# Patient Record
Sex: Male | Born: 1935 | Race: White | Hispanic: No | Marital: Married
Health system: Southern US, Community
[De-identification: ages and names within clinical notes are randomized; demographics above are authoritative.]

---

## 2013-10-15 ENCOUNTER — Inpatient Hospital Stay
Admission: AD | Admit: 2013-10-15 | Discharge: 2013-10-25 | Disposition: E | Payer: Self-pay | Source: Ambulatory Visit | Attending: Internal Medicine | Admitting: Internal Medicine

## 2013-10-16 ENCOUNTER — Other Ambulatory Visit (HOSPITAL_COMMUNITY): Payer: Self-pay

## 2013-10-16 LAB — COMPREHENSIVE METABOLIC PANEL
ALBUMIN: 2.4 g/dL — AB (ref 3.5–5.2)
ALK PHOS: 118 U/L — AB (ref 39–117)
ALT: 41 U/L (ref 0–53)
AST: 21 U/L (ref 0–37)
BUN: 31 mg/dL — ABNORMAL HIGH (ref 6–23)
CO2: 24 mEq/L (ref 19–32)
CREATININE: 0.76 mg/dL (ref 0.50–1.35)
Calcium: 6.3 mg/dL — CL (ref 8.4–10.5)
Chloride: 97 mEq/L (ref 96–112)
GFR calc non Af Amer: 85 mL/min — ABNORMAL LOW (ref >90)
Glucose, Bld: 279 mg/dL — ABNORMAL HIGH (ref 70–99)
POTASSIUM: 3.2 meq/L — AB (ref 3.7–5.3)
Sodium: 136 mEq/L — ABNORMAL LOW (ref 137–147)
TOTAL PROTEIN: 6.3 g/dL (ref 6.0–8.3)
Total Bilirubin: 1.2 mg/dL (ref 0.3–1.2)

## 2013-10-16 LAB — CBC
HEMATOCRIT: 31.4 % — AB (ref 39.0–52.0)
Hemoglobin: 10.7 g/dL — ABNORMAL LOW (ref 13.0–17.0)
MCH: 30.1 pg (ref 26.0–34.0)
MCHC: 34.1 g/dL (ref 30.0–36.0)
MCV: 88.5 fL (ref 78.0–100.0)
Platelets: 164 10*3/uL (ref 150–400)
RBC: 3.55 MIL/uL — ABNORMAL LOW (ref 4.22–5.81)
RDW: 15.3 % (ref 11.5–15.5)
WBC: 6.7 10*3/uL (ref 4.0–10.5)

## 2013-10-16 LAB — PREALBUMIN: PREALBUMIN: 19.3 mg/dL (ref 17.0–34.0)

## 2013-10-16 LAB — TSH: TSH: 0.764 u[IU]/mL (ref 0.350–4.500)

## 2013-10-17 ENCOUNTER — Other Ambulatory Visit (HOSPITAL_COMMUNITY): Payer: Self-pay

## 2013-10-18 LAB — CBC WITH DIFFERENTIAL/PLATELET
BASOS ABS: 0 10*3/uL (ref 0.0–0.1)
Basophils Relative: 0 % (ref 0–1)
EOS ABS: 0 10*3/uL (ref 0.0–0.7)
Eosinophils Relative: 0 % (ref 0–5)
HCT: 33.8 % — ABNORMAL LOW (ref 39.0–52.0)
HEMOGLOBIN: 11.7 g/dL — AB (ref 13.0–17.0)
Lymphocytes Relative: 12 % (ref 12–46)
Lymphs Abs: 1.5 10*3/uL (ref 0.7–4.0)
MCH: 31.2 pg (ref 26.0–34.0)
MCHC: 34.6 g/dL (ref 30.0–36.0)
MCV: 90.1 fL (ref 78.0–100.0)
MONOS PCT: 5 % (ref 3–12)
Monocytes Absolute: 0.6 10*3/uL (ref 0.1–1.0)
Neutro Abs: 10.4 10*3/uL — ABNORMAL HIGH (ref 1.7–7.7)
Neutrophils Relative %: 83 % — ABNORMAL HIGH (ref 43–77)
Platelets: 208 10*3/uL (ref 150–400)
RBC: 3.75 MIL/uL — ABNORMAL LOW (ref 4.22–5.81)
RDW: 16.3 % — ABNORMAL HIGH (ref 11.5–15.5)
WBC: 12.5 10*3/uL — ABNORMAL HIGH (ref 4.0–10.5)

## 2013-10-18 LAB — RENAL FUNCTION PANEL
ALBUMIN: 2.9 g/dL — AB (ref 3.5–5.2)
BUN: 54 mg/dL — AB (ref 6–23)
CALCIUM: 7.9 mg/dL — AB (ref 8.4–10.5)
CO2: 24 mEq/L (ref 19–32)
Chloride: 98 mEq/L (ref 96–112)
Creatinine, Ser: 0.97 mg/dL (ref 0.50–1.35)
GFR calc Af Amer: 89 mL/min — ABNORMAL LOW (ref >90)
GFR calc non Af Amer: 76 mL/min — ABNORMAL LOW (ref >90)
GLUCOSE: 311 mg/dL — AB (ref 70–99)
PHOSPHORUS: 3.6 mg/dL (ref 2.3–4.6)
POTASSIUM: 4.6 meq/L (ref 3.7–5.3)
SODIUM: 138 meq/L (ref 137–147)

## 2013-10-18 LAB — URINALYSIS, ROUTINE W REFLEX MICROSCOPIC
Bilirubin Urine: NEGATIVE
GLUCOSE, UA: 250 mg/dL — AB
Hgb urine dipstick: NEGATIVE
Ketones, ur: NEGATIVE mg/dL
LEUKOCYTES UA: NEGATIVE
Nitrite: NEGATIVE
PH: 5.5 (ref 5.0–8.0)
PROTEIN: NEGATIVE mg/dL
SPECIFIC GRAVITY, URINE: 1.017 (ref 1.005–1.030)
Urobilinogen, UA: 1 mg/dL (ref 0.0–1.0)

## 2013-10-18 LAB — MAGNESIUM: Magnesium: 2.2 mg/dL (ref 1.5–2.5)

## 2013-10-19 ENCOUNTER — Other Ambulatory Visit (HOSPITAL_COMMUNITY): Payer: Self-pay

## 2013-10-19 LAB — COMPREHENSIVE METABOLIC PANEL
ALBUMIN: 3.1 g/dL — AB (ref 3.5–5.2)
ALT: 56 U/L — ABNORMAL HIGH (ref 0–53)
AST: 30 U/L (ref 0–37)
Alkaline Phosphatase: 171 U/L — ABNORMAL HIGH (ref 39–117)
BUN: 85 mg/dL — ABNORMAL HIGH (ref 6–23)
CALCIUM: 9.1 mg/dL (ref 8.4–10.5)
CO2: 24 mEq/L (ref 19–32)
Chloride: 98 mEq/L (ref 96–112)
Creatinine, Ser: 1.08 mg/dL (ref 0.50–1.35)
GFR calc Af Amer: 73 mL/min — ABNORMAL LOW (ref >90)
GFR, EST NON AFRICAN AMERICAN: 63 mL/min — AB (ref >90)
Glucose, Bld: 369 mg/dL — ABNORMAL HIGH (ref 70–99)
Potassium: 5.3 mEq/L (ref 3.7–5.3)
SODIUM: 141 meq/L (ref 137–147)
Total Bilirubin: 1.1 mg/dL (ref 0.3–1.2)
Total Protein: 7.9 g/dL (ref 6.0–8.3)

## 2013-10-19 LAB — CBC WITH DIFFERENTIAL/PLATELET
BASOS ABS: 0 10*3/uL (ref 0.0–0.1)
Basophils Relative: 0 % (ref 0–1)
EOS PCT: 0 % (ref 0–5)
Eosinophils Absolute: 0 10*3/uL (ref 0.0–0.7)
HCT: 39.1 % (ref 39.0–52.0)
Hemoglobin: 13.1 g/dL (ref 13.0–17.0)
Lymphocytes Relative: 9 % — ABNORMAL LOW (ref 12–46)
Lymphs Abs: 1.8 10*3/uL (ref 0.7–4.0)
MCH: 30.5 pg (ref 26.0–34.0)
MCHC: 33.5 g/dL (ref 30.0–36.0)
MCV: 91.1 fL (ref 78.0–100.0)
MONO ABS: 0.6 10*3/uL (ref 0.1–1.0)
Monocytes Relative: 3 % (ref 3–12)
Neutro Abs: 17.4 10*3/uL — ABNORMAL HIGH (ref 1.7–7.7)
Neutrophils Relative %: 87 % — ABNORMAL HIGH (ref 43–77)
PLATELETS: 272 10*3/uL (ref 150–400)
RBC: 4.29 MIL/uL (ref 4.22–5.81)
RDW: 16.7 % — AB (ref 11.5–15.5)
WBC: 19.8 10*3/uL — ABNORMAL HIGH (ref 4.0–10.5)

## 2013-10-19 LAB — PROCALCITONIN: PROCALCITONIN: 0.75 ng/mL

## 2013-10-19 LAB — PREALBUMIN: Prealbumin: 25.3 mg/dL (ref 17.0–34.0)

## 2013-10-19 LAB — CLOSTRIDIUM DIFFICILE BY PCR: Toxigenic C. Difficile by PCR: NEGATIVE

## 2013-10-19 MED ORDER — CEFAZOLIN SODIUM-DEXTROSE 2-3 GM-% IV SOLR: 2.0000 g | Freq: Once | INTRAVENOUS | Status: DC

## 2013-10-19 NOTE — H&P (Signed)
Referring Physician: Dr. Laren Everts HPI: Zachary Clarke is an 80 y.o. male who is confused and agitated during exam and no family is present today. All history is obtained by chart review. The patient was transferred to [facility] 09/24/13 for respiratory failure and unable to wean. The patient has been evaluated for dysphagia given severe malnutrition and has been on a nectar thick dysphagia 2 diet with poor intake <25%. IR received request for image guided percutaneous gastric tube placement.   Past Medical History: COPD, Pulmonary fibrosis, bronchiectasis, DM2, renal failure, respiratory failure, PVD, ITP, NHL, Lung cancer, GERD, Anemia, DVT  Past Surgical History: lung resection 2010  Family History: No family history on file.  Social History: Previous tobacco user  Allergies: Percocet, tramadol, allopurinol, coumadin  Medications: See chart for full medication list. Aspirin 69m, solu-medrol, lasix, lovenox  Please HPI for pertinent positives, otherwise complete 10 system ROS negative.  Physical Exam: Temp 40F, HR: 78bpm, O2: 98% fio2 35% on O2 mask, BP: 124/783mg There is no height or weight on file to calculate BMI.  General Appearance:  Alert, confused, agitated  Head:  Normocephalic, without obvious abnormality, atraumatic  Neck: Supple, symmetrical, trachea midline  Lungs:   Wheezes bilaterally, diminshed bases bilaterally, no w/r/r, respirations unlabored without use of accessory muscles.  Chest Wall:  No tenderness or deformity  Heart:  Regular rate and rhythm, S1, S2 normal, no murmur, rub or gallop.  Abdomen:   Soft, non-tender, non distended, (+) BS  Extremities: Extremities normal, atraumatic, no cyanosis or edema  Neurologic: No gross deficits.   Results for orders placed during the hospital encounter of 09/24/2013 (from the past 48 hour(s))  CBC WITH DIFFERENTIAL     Status: Abnormal   Collection Time    10/18/13  5:00 AM      Result Value Range   WBC 12.5 (*) 4.0 - 10.5 K/uL    RBC 3.75 (*) 4.22 - 5.81 MIL/uL   Hemoglobin 11.7 (*) 13.0 - 17.0 g/dL   HCT 33.8 (*) 39.0 - 52.0 %   MCV 90.1  78.0 - 100.0 fL   MCH 31.2  26.0 - 34.0 pg   MCHC 34.6  30.0 - 36.0 g/dL   RDW 16.3 (*) 11.5 - 15.5 %   Platelets 208  150 - 400 K/uL   Neutrophils Relative % 83 (*) 43 - 77 %   Neutro Abs 10.4 (*) 1.7 - 7.7 K/uL   Lymphocytes Relative 12  12 - 46 %   Lymphs Abs 1.5  0.7 - 4.0 K/uL   Monocytes Relative 5  3 - 12 %   Monocytes Absolute 0.6  0.1 - 1.0 K/uL   Eosinophils Relative 0  0 - 5 %   Eosinophils Absolute 0.0  0.0 - 0.7 K/uL   Basophils Relative 0  0 - 1 %   Basophils Absolute 0.0  0.0 - 0.1 K/uL  MAGNESIUM     Status: None   Collection Time    10/18/13  5:00 AM      Result Value Range   Magnesium 2.2  1.5 - 2.5 mg/dL  RENAL FUNCTION PANEL     Status: Abnormal   Collection Time    10/18/13  5:00 AM      Result Value Range   Sodium 138  137 - 147 mEq/L   Potassium 4.6  3.7 - 5.3 mEq/L   Chloride 98  96 - 112 mEq/L   CO2 24  19 - 32 mEq/L   Glucose,  Bld 311 (*) 70 - 99 mg/dL   BUN 54 (*) 6 - 23 mg/dL   Creatinine, Ser 0.97  0.50 - 1.35 mg/dL   Calcium 7.9 (*) 8.4 - 10.5 mg/dL   Phosphorus 3.6  2.3 - 4.6 mg/dL   Albumin 2.9 (*) 3.5 - 5.2 g/dL   GFR calc non Af Amer 76 (*) >90 mL/min   GFR calc Af Amer 89 (*) >90 mL/min   Comment: (NOTE)     The eGFR has been calculated using the CKD EPI equation.     This calculation has not been validated in all clinical situations.     eGFR's persistently <90 mL/min signify possible Chronic Kidney     Disease.  URINALYSIS, ROUTINE W REFLEX MICROSCOPIC     Status: Abnormal   Collection Time    10/18/13  7:43 PM      Result Value Range   Color, Urine YELLOW  YELLOW   APPearance CLOUDY (*) CLEAR   Specific Gravity, Urine 1.017  1.005 - 1.030   pH 5.5  5.0 - 8.0   Glucose, UA 250 (*) NEGATIVE mg/dL   Hgb urine dipstick NEGATIVE  NEGATIVE   Bilirubin Urine NEGATIVE  NEGATIVE   Ketones, ur NEGATIVE  NEGATIVE mg/dL    Protein, ur NEGATIVE  NEGATIVE mg/dL   Urobilinogen, UA 1.0  0.0 - 1.0 mg/dL   Nitrite NEGATIVE  NEGATIVE   Leukocytes, UA NEGATIVE  NEGATIVE   Comment: MICROSCOPIC NOT DONE ON URINES WITH NEGATIVE PROTEIN, BLOOD, LEUKOCYTES, NITRITE, OR GLUCOSE <1000 mg/dL.  CBC WITH DIFFERENTIAL     Status: Abnormal   Collection Time    10/19/13  5:40 AM      Result Value Range   WBC 19.8 (*) 4.0 - 10.5 K/uL   RBC 4.29  4.22 - 5.81 MIL/uL   Hemoglobin 13.1  13.0 - 17.0 g/dL   HCT 39.1  39.0 - 52.0 %   MCV 91.1  78.0 - 100.0 fL   MCH 30.5  26.0 - 34.0 pg   MCHC 33.5  30.0 - 36.0 g/dL   RDW 16.7 (*) 11.5 - 15.5 %   Platelets 272  150 - 400 K/uL   Neutrophils Relative % 87 (*) 43 - 77 %   Neutro Abs 17.4 (*) 1.7 - 7.7 K/uL   Lymphocytes Relative 9 (*) 12 - 46 %   Lymphs Abs 1.8  0.7 - 4.0 K/uL   Monocytes Relative 3  3 - 12 %   Monocytes Absolute 0.6  0.1 - 1.0 K/uL   Eosinophils Relative 0  0 - 5 %   Eosinophils Absolute 0.0  0.0 - 0.7 K/uL   Basophils Relative 0  0 - 1 %   Basophils Absolute 0.0  0.0 - 0.1 K/uL  COMPREHENSIVE METABOLIC PANEL     Status: Abnormal   Collection Time    10/19/13  5:40 AM      Result Value Range   Sodium 141  137 - 147 mEq/L   Potassium 5.3  3.7 - 5.3 mEq/L   Chloride 98  96 - 112 mEq/L   CO2 24  19 - 32 mEq/L   Glucose, Bld 369 (*) 70 - 99 mg/dL   BUN 85 (*) 6 - 23 mg/dL   Creatinine, Ser 1.08  0.50 - 1.35 mg/dL   Calcium 9.1  8.4 - 10.5 mg/dL   Total Protein 7.9  6.0 - 8.3 g/dL   Albumin 3.1 (*) 3.5 - 5.2 g/dL  AST 30  0 - 37 U/L   ALT 56 (*) 0 - 53 U/L   Alkaline Phosphatase 171 (*) 39 - 117 U/L   Total Bilirubin 1.1  0.3 - 1.2 mg/dL   GFR calc non Af Amer 63 (*) >90 mL/min   GFR calc Af Amer 73 (*) >90 mL/min   Comment: (NOTE)     The eGFR has been calculated using the CKD EPI equation.     This calculation has not been validated in all clinical situations.     eGFR's persistently <90 mL/min signify possible Chronic Kidney     Disease.   PREALBUMIN     Status: None   Collection Time    10/19/13  5:40 AM      Result Value Range   Prealbumin 25.3  17.0 - 34.0 mg/dL   Comment: Performed at [facility]   No results found.  Assessment/Plan Respiratory failure s/p extubation 10/08/13. Dysphagia. Malnutrition <25% daily intake. Request for image guided percutaneous gastric tube placement. Images and labs reviewed. Leukocytosis, afebrile, UA 10/18/13 negative for UTI. Will give barium today via NGT, check KUB and labs 10/20/13, ancef ordered on call. Consent is pending, waiting to talk with patient's spouse. DVT, on lovenox, will hold 1/27. Non hodgkins lymphoma, chemotherapy 2012 Non small cell lung cancer history of resection 2010 Anemia DM type 2 COPD Pulmonary fibrosis GERD Renal failure, resolved.  PVD History of ITP   Rockney Ghee 10/19/2013, 1:42 PM

## 2013-10-20 ENCOUNTER — Other Ambulatory Visit (HOSPITAL_COMMUNITY): Payer: Self-pay

## 2013-10-20 LAB — CBC WITH DIFFERENTIAL/PLATELET
BASOS ABS: 0 10*3/uL (ref 0.0–0.1)
Basophils Relative: 0 % (ref 0–1)
Eosinophils Absolute: 0 10*3/uL (ref 0.0–0.7)
Eosinophils Relative: 0 % (ref 0–5)
HEMATOCRIT: 35.1 % — AB (ref 39.0–52.0)
Hemoglobin: 12.1 g/dL — ABNORMAL LOW (ref 13.0–17.0)
LYMPHS ABS: 2.9 10*3/uL (ref 0.7–4.0)
Lymphocytes Relative: 10 % — ABNORMAL LOW (ref 12–46)
MCH: 31.1 pg (ref 26.0–34.0)
MCHC: 34.5 g/dL (ref 30.0–36.0)
MCV: 90.2 fL (ref 78.0–100.0)
Monocytes Absolute: 0.9 10*3/uL (ref 0.1–1.0)
Monocytes Relative: 3 % (ref 3–12)
NEUTROS ABS: 24.9 10*3/uL — AB (ref 1.7–7.7)
Neutrophils Relative %: 87 % — ABNORMAL HIGH (ref 43–77)
Platelets: 267 10*3/uL (ref 150–400)
RBC: 3.89 MIL/uL — ABNORMAL LOW (ref 4.22–5.81)
RDW: 16.6 % — AB (ref 11.5–15.5)
WBC: 28.7 10*3/uL — ABNORMAL HIGH (ref 4.0–10.5)

## 2013-10-20 LAB — BASIC METABOLIC PANEL
BUN: 144 mg/dL — ABNORMAL HIGH (ref 6–23)
CO2: 19 mEq/L (ref 19–32)
CREATININE: 2.45 mg/dL — AB (ref 0.50–1.35)
Calcium: 8.1 mg/dL — ABNORMAL LOW (ref 8.4–10.5)
Chloride: 90 mEq/L — ABNORMAL LOW (ref 96–112)
GFR, EST AFRICAN AMERICAN: 27 mL/min — AB (ref >90)
GFR, EST NON AFRICAN AMERICAN: 24 mL/min — AB (ref >90)
Glucose, Bld: 515 mg/dL — ABNORMAL HIGH (ref 70–99)
POTASSIUM: 5.8 meq/L — AB (ref 3.7–5.3)
Sodium: 137 mEq/L (ref 137–147)

## 2013-10-20 LAB — PROCALCITONIN: Procalcitonin: 1.09 ng/mL

## 2013-10-20 LAB — PROTIME-INR
INR: 1.3 (ref 0.00–1.49)
Prothrombin Time: 15.9 seconds — ABNORMAL HIGH (ref 11.6–15.2)

## 2013-10-25 DEATH — deceased

## 2014-12-24 IMAGING — CR DG CHEST 1V PORT
1 series · 1 of 1 positions shown · non-contrast
Comparison: Single view of the chest 10/16/2013.

CLINICAL DATA: Respiratory failure.

EXAM:
PORTABLE CHEST - 1 VIEW

[AP]
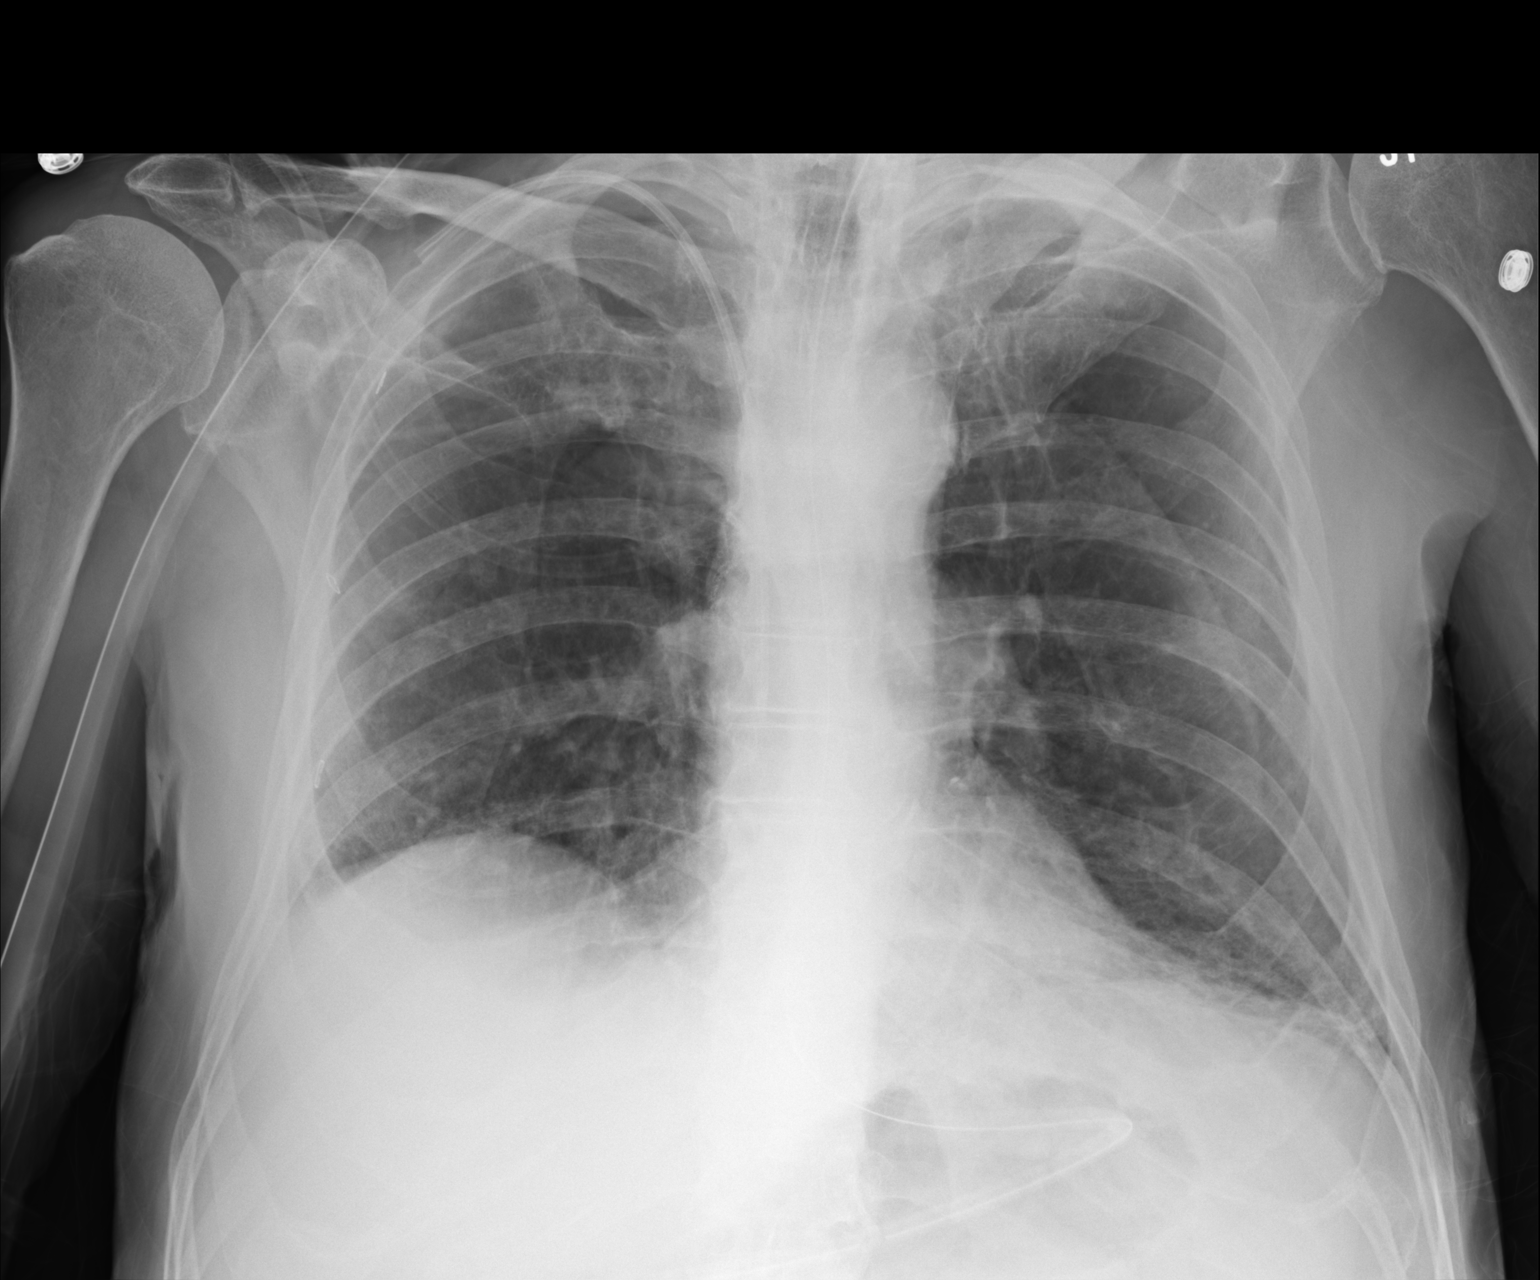

[1 of 1 positions shown; findings below may reference images not displayed]

FINDINGS: The patient has a new NG tube which courses into the stomach and
below the inferior margin of the film. Right IJ catheter is
unchanged. Aeration is improved compatible with decreased
interstitial edema. Mild basilar atelectasis is noted. No
pneumothorax. Heart size is normal.
IMPRESSION: Improved bilateral aeration most compatible with decreased edema.

NG tube courses into the stomach and below the inferior margin of
the film.

## 2014-12-25 IMAGING — DX DG ABD PORTABLE 1V
2 series · 2 of 2 positions shown · non-contrast
Comparison: Portable exam 6011 hr compared to 10/17/2013

CLINICAL DATA: Pre G-tube placement, assess barium distribution

EXAM:
PORTABLE ABDOMEN - 1 VIEW

[supine ap (1 of 2)]
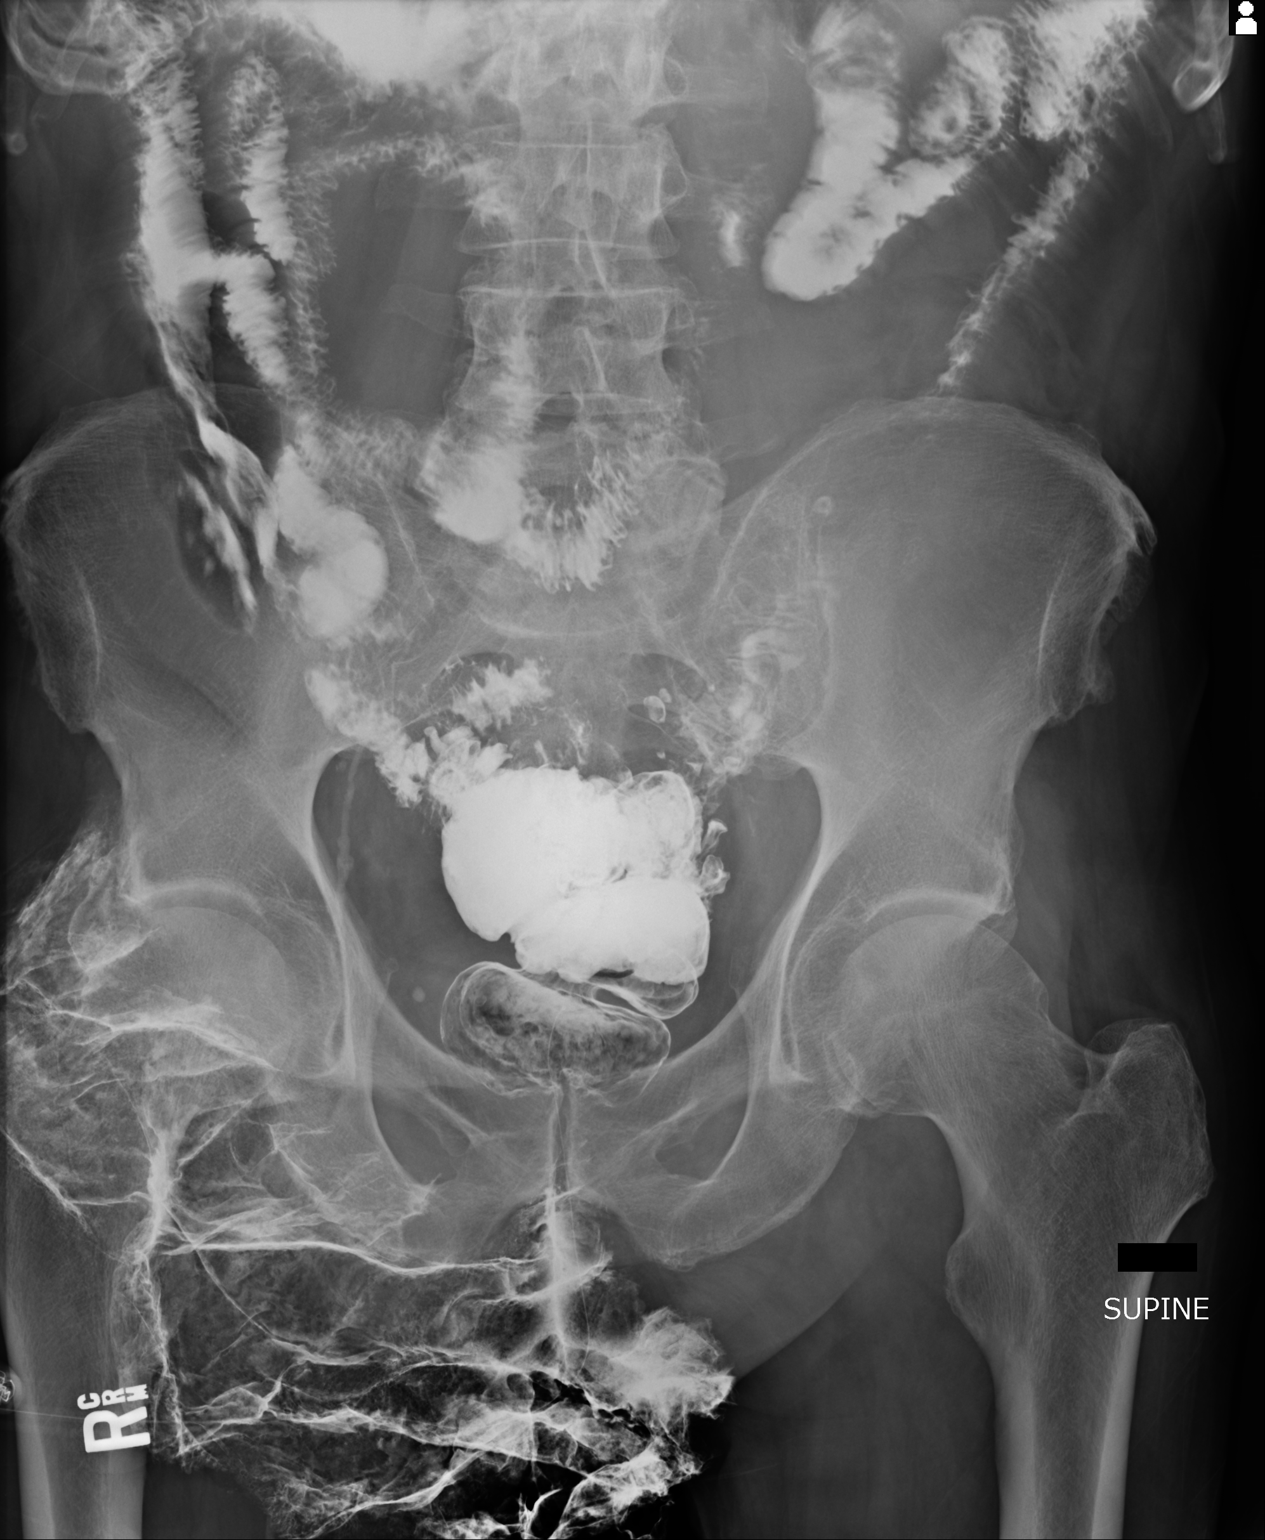

[supine ap (2 of 2)]
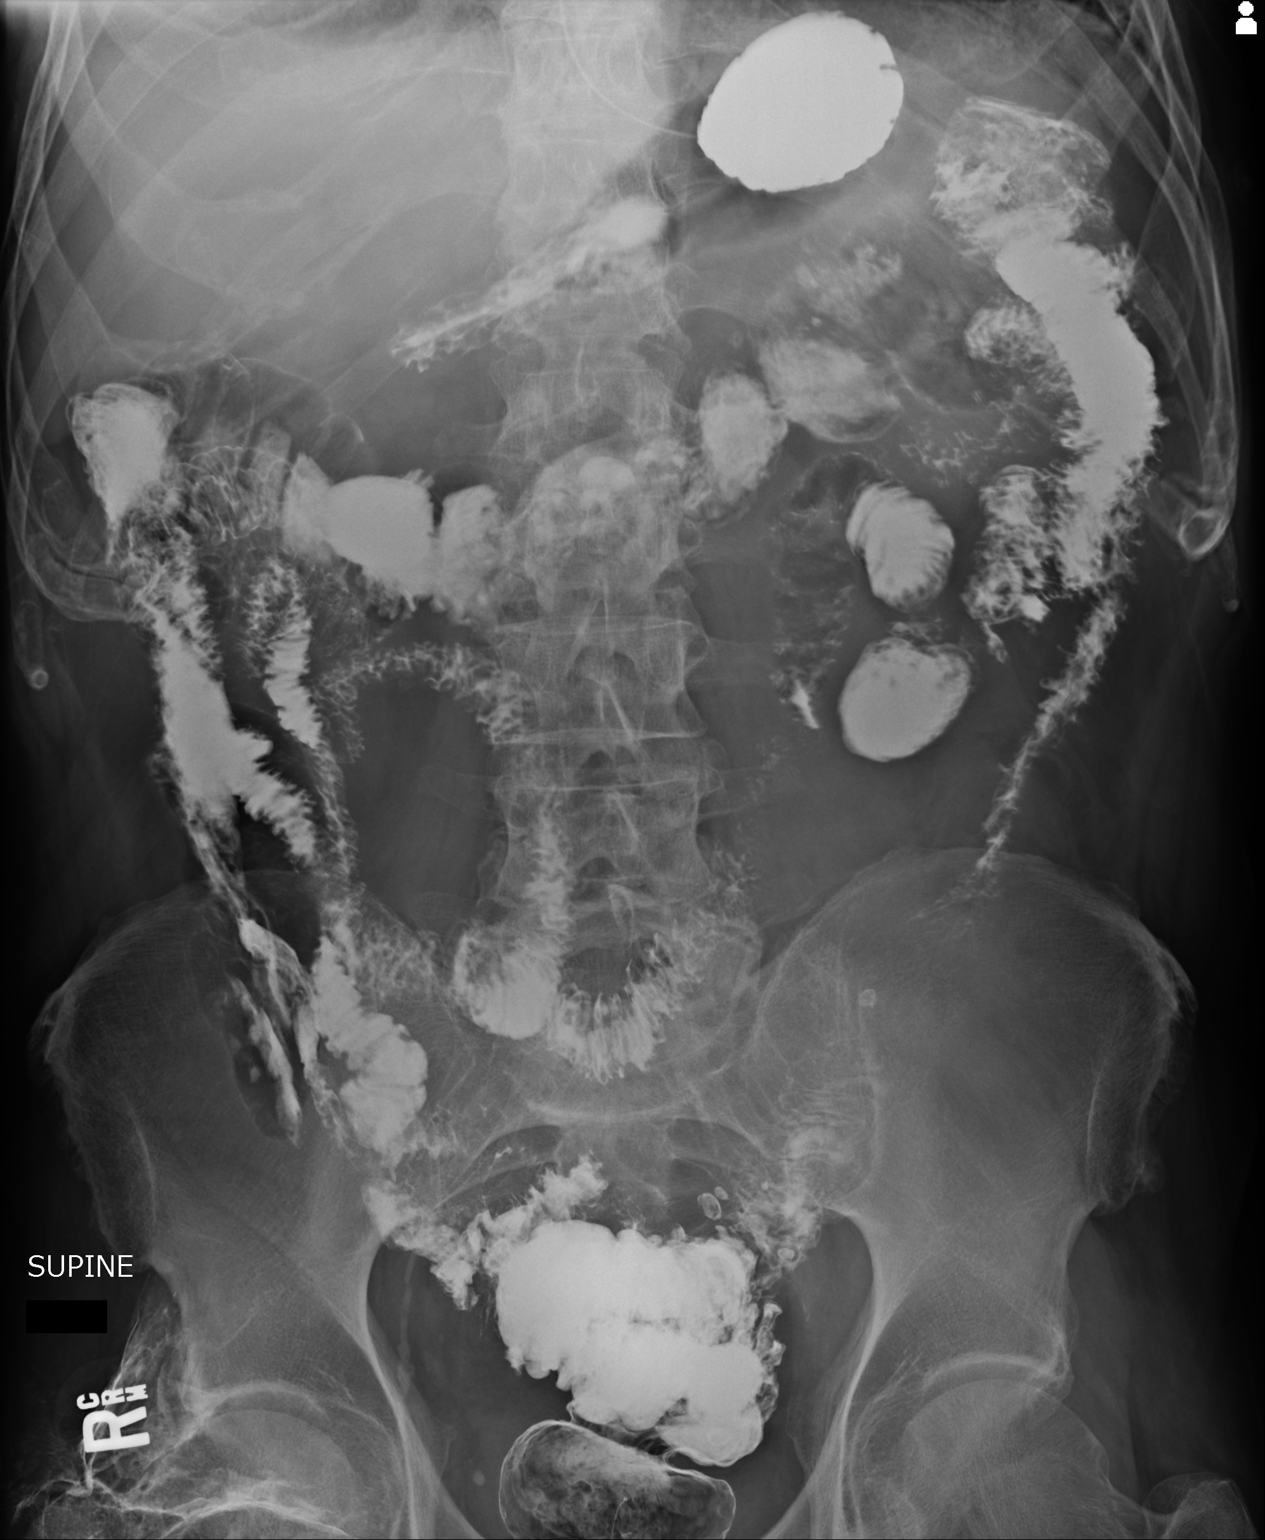

[2 of 2 positions shown; findings below may reference images not displayed]

FINDINGS: Contrast is identified throughout distal small bowel loops and colon
to rectum.

Few diverticula noted at sigmoid colon.

Small amount of retained contrast within stomach.

No evidence of bowel obstruction or dilatation.

Bones appear demineralized.
IMPRESSION: Barium distribution as above.
